# Patient Record
Sex: Female | Born: 1990 | Hispanic: Yes | Marital: Single | Smoking: Never smoker
Health system: Southern US, Community
[De-identification: ages and names within clinical notes are randomized; demographics above are authoritative.]

## PROBLEM LIST (undated history)

## (undated) DIAGNOSIS — E119 Type 2 diabetes mellitus without complications: Secondary | ICD-10-CM

## (undated) DIAGNOSIS — F419 Anxiety disorder, unspecified: Secondary | ICD-10-CM

## (undated) DIAGNOSIS — E669 Obesity, unspecified: Secondary | ICD-10-CM

## (undated) DIAGNOSIS — E282 Polycystic ovarian syndrome: Secondary | ICD-10-CM

## (undated) DIAGNOSIS — D649 Anemia, unspecified: Secondary | ICD-10-CM

## (undated) DIAGNOSIS — F32A Depression, unspecified: Secondary | ICD-10-CM

## (undated) HISTORY — PX: WISDOM TOOTH EXTRACTION: SHX21

## (undated) HISTORY — DX: Anxiety disorder, unspecified: F41.9

## (undated) HISTORY — DX: Polycystic ovarian syndrome: E28.2

## (undated) HISTORY — DX: Anemia, unspecified: D64.9

## (undated) HISTORY — DX: Obesity, unspecified: E66.9

## (undated) HISTORY — DX: Depression, unspecified: F32.A

---
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
## 2009-12-11 ENCOUNTER — Ambulatory Visit: Payer: Self-pay | Admitting: Internal Medicine

## 2010-01-06 ENCOUNTER — Ambulatory Visit: Payer: Self-pay | Admitting: Internal Medicine

## 2011-08-19 ENCOUNTER — Encounter: Payer: Self-pay | Admitting: Maternal & Fetal Medicine

## 2011-08-19 LAB — TSH: Thyroid Stimulating Horm: 1.48 u[IU]/mL

## 2011-08-19 LAB — PROTEIN / CREATININE RATIO, URINE
Creatinine, Urine: 78.2 mg/dL (ref 30.0–125.0)
Protein, Random Urine: 11 mg/dL (ref 0–12)
Protein/Creat. Ratio: 141 mg/gCREAT (ref 0–200)

## 2011-08-19 LAB — BASIC METABOLIC PANEL
Anion Gap: 12 (ref 7–16)
BUN: 8 mg/dL (ref 7–18)
EGFR (African American): >60
Glucose: 78 mg/dL (ref 65–99)
Potassium: 4 mmol/L (ref 3.5–5.1)
Sodium: 142 mmol/L (ref 136–145)

## 2011-08-19 LAB — HEPATIC FUNCTION PANEL A (ARMC)
Albumin: 3.1 g/dL — ABNORMAL LOW (ref 3.4–5.0)
Alkaline Phosphatase: 47 U/L — ABNORMAL LOW (ref 50–136)
Bilirubin,Total: 0.3 mg/dL (ref 0.2–1.0)
SGOT(AST): 20 U/L (ref 15–37)
SGPT (ALT): 21 U/L
Total Protein: 7.3 g/dL (ref 6.4–8.2)

## 2012-01-28 ENCOUNTER — Inpatient Hospital Stay: Payer: Self-pay

## 2012-01-28 LAB — CBC WITH DIFFERENTIAL/PLATELET
Basophil %: 0.6 %
Eosinophil %: 0.2 %
HCT: 36.9 % (ref 35.0–47.0)
MCV: 79 fL — ABNORMAL LOW (ref 80–100)
Monocyte %: 4.8 %
RBC: 4.68 10*6/uL (ref 3.80–5.20)
WBC: 13.3 10*3/uL — ABNORMAL HIGH (ref 3.6–11.0)

## 2012-01-28 LAB — HEMOGLOBIN: HGB: 10.2 g/dL — ABNORMAL LOW (ref 12.0–16.0)

## 2014-06-21 NOTE — L&D Delivery Note (Signed)
VAGINAL DELIVERY NOTE:  Date of Delivery: 02/22/2015 Primary OB: [facility]  Gestational Age/EDD: [redacted]w[redacted]d 02/27/2015, Alternate EDD Entry Antepartum complications: none Attending Physician: Annamarie Major, MD, FACOG Delivery Type: spontaneous vaginal delivery  Anesthesia: epidural Laceration: 2nd degree Episiotomy: none Placenta: spontaneous, uterine inversion noted and replaced without difficulty; pt stable throughout Intrapartum complications: None Estimated Blood Loss: 500 mL GBS: neg Baby: Liveborn female, Apgars 8/9, weight 8 #, 15 oz,

## 2014-10-13 NOTE — Consult Note (Signed)
Referral Information:   Reason for Referral 24 yo G1 EDD 02/02/12 by US performed on 07/29/11 (EGA at the time of that [place]: 13 weeks 1 day. She is 16 weeks 1 day today and referred secondary to BMI >40 and history of bariatric surgery (lap banding) in 2010. She lost 70 lbs following the surgery and has not regained this weight.    Referring Physician [facility], Dr. Georgianne Fick    Prenatal Hx as above    Past Obstetrical Hx G1   Home Medications: Medication Instructions Status  Prenatal Multivitamins oral tablet 1 tab(s) orally once a day Active   Allergies:   No Known Allergies:   Vital Signs/Notes:  Nursing Vital Signs: **Vital Signs.:   28-Feb-13 13:14   Vital Signs Type Routine   Temperature Temperature (F) 98.7   Celsius 37   Temperature Source oral   Pulse Pulse 79   Pulse source per Dinamap   Respirations Respirations 14   Systolic BP Systolic BP 740   Diastolic BP (mmHg) Diastolic BP (mmHg) 55   Mean BP 74   BP Source Dinamap   Perinatal Consult:   PMed Hx Rubella Immune, Hx of varicella    Past Medical History cont'd 1.  History of ?elevated liver function testing--she reports elevation in lfts since a prolonged viral illness during middle school.  She denies history of EBV, but states her lfts have been persistently elevated since that 3-week long illness. She denies having liver imaging/ultrasound.  She states hepatitis screening and other evalations were negative. 2. Obesity/Lap Band Surgery/Insulin resistance--she reports insulin resistance prior to her bariatric surgery.  It is unclear whether her 70-lb weight loss corrected this condition.  She was temoporarily placed on metformin but experienced mood swings/anger while on this treatment so she discontinued the metformin and opted to pursue surgical intervention. She has seen a primary care MD (she is unsure of name)at [facility].    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    Occupation Father [employer]    Soc Hx no tobacco, etoh, ilicit drug use   Review Of Systems:   Medications/Allergies Reviewed Medications/Allergies reviewed   Misc Urine Chem:  28-Feb-13 13:53    Creatinine, Urine 78.2   Protein, Random Urine 11   Protein/Creat Ratio (comp) 141  Routine Chem:  28-Feb-13 14:09    Glucose, Serum 78   BUN 8   Creatinine (comp) 0.51   Sodium, Serum 142   Potassium, Serum 4.0   Chloride, Serum 105   CO2, Serum 25   Calcium (Total), Serum 9.0   Anion Gap 12   Osmolality (calc) 280   eGFR (African American) >60   eGFR (Non-African American) >60  Thyroid:  28-Feb-13 14:09    Thyroid Stimulating Hormone 1.48   Impression/Recommendations:   Impression 24 yo G1 at 16 weeks 1 day with history of insulin resistance (prior to bariatric surgery), gastric banding, and BMI>40.  We addressed concerns related to obesity in pregnacy such as: preeclampsia, gestational diabetes, and need for operative delivery.  We addressed the potential benefits of low dose aspirin administration for preeclampsia risk reduction in patients at high risk for the development of preeclampsia.  We also readdressed weight gain recommenations (15-20lbs) as well as the need for healthy dietary choices and avoidance of overt weight loss attempts at this time.  We also reviewed benefits of  breastfeeding for potential weight maintenance as well as for diabetes risk reduction. We addresed the fact that the lap band surgery is not associated with increased pregnancy risk or metabolic derangements (unlike other bariatric surgeries).    Recommendations 1. Baseline CMP, p:c ratio, and liver function testing.  The CMP and p:c ratio were normal today.  If the liver function testing is abnormal, she can be referred to GI for further evaluation. We reviewed her normal glucola/GDM screening and the need for repeat  screening at [redacted] weeks gestation.  2. TSH was normal today 3. Recommend pregnancy surveillance with detailed ultrasound and fetal growth assessements as outlined below. She declined first trimester screening 4.  I recommended she initate low dose/baby aspirin (81 mg/daily) 5. We addressed healthy diet and exercise (walking) She declined lifestyles referral to nutritionist as she has seen nutritionists in the past 6. Baseline ECG   Plan:   Genetic Counseling no    Prenatal Diagnosis Options Level II [place], declined first trimester screening    Ultrasound at what gestational ages Monthly > 28 weeks    Additional Testing Thyroid panel, normal TSH today     Total Time Spent with Patient 45 minutes    >50% of visit spent in couseling/coordination of care yes    Office Use Only 99243  Level 3 (65mn) NEW office consult detailed   Coding Description: MATERNAL CONDITIONS/HISTORY INDICATION(S).   Obesity - BMI greater than equal to 30.   OTHER: bariatric surgery.  Electronic Signatures: SManfred Shirts(MD)  (Signed [id number])  Authored: Referral, Home Medications, Allergies, Vital Signs/Notes, Consult, Exam, Lab, Impression, Plan, Billing, Coding Description   Last Updated: 28-Feb-13 15:41 by SManfred Shirts(MD)

## 2014-10-29 NOTE — H&P (Signed)
L&D Evaluation:  History Expanded:   HPI 24 yo G1 whose EDC = 02/09/12.  Pt presents in labor    Blood Type (Maternal) A positive    Group B Strep Results Maternal (Result >5wks must be treated as unknown) negative    Maternal HIV Negative    Maternal Syphilis Ab Nonreactive    Maternal Varicella Immune    Rubella Results (Maternal) immune    Patient's Medical History ob3esity    Patient's Surgical History Lap band gastric bypass    Medications Pre Natal Vitamins    Allergies PCN   Exam:   Vital Signs stable    Chest clear    Heart normal sinus rhythm    Abdomen gravid, tender with contractions    Estimated Fetal Weight Average for gestational age    Back no CVAT    Pelvic 6 cm    Ucx regular   Impression:   Impression active labor   Electronic Signatures: Towana Badgerosenow, Philip J (MD)  (Signed 09-Aug-13 07:04)  Authored: L&D Evaluation   Last Updated: 09-Aug-13 07:04 by Towana Badgerosenow, Philip J (MD)

## 2014-12-16 ENCOUNTER — Encounter: Payer: Self-pay | Admitting: *Deleted

## 2014-12-16 ENCOUNTER — Encounter: Payer: Medicaid Other | Attending: Certified Nurse Midwife | Admitting: *Deleted

## 2014-12-16 VITALS — BP 100/70 | Ht 66.5 in | Wt 292.6 lb

## 2014-12-16 DIAGNOSIS — O24419 Gestational diabetes mellitus in pregnancy, unspecified control: Secondary | ICD-10-CM | POA: Insufficient documentation

## 2014-12-16 DIAGNOSIS — O24410 Gestational diabetes mellitus in pregnancy, diet controlled: Secondary | ICD-10-CM

## 2014-12-16 NOTE — Progress Notes (Signed)
Diabetes Self-Management Education  Visit Type: First/Initial  Appt. Start Time: 0900 Appt. End Time: 1030  12/16/2014  [name], identified by name and date of birth, is a 24 y.o. female with a diagnosis of Diabetes: Gestational Diabetes.    ASSESSMENT Blood pressure 100/70, height 5' 6.5" (1.689 m), weight 292 lb 9.6 oz (132.722 kg), last menstrual period 05/23/2014. Body mass index is 46.52 kg/(m^2).  Initial Visit Information: Are you currently following a meal plan?: Yes What type of meal plan do you follow?: drinking more water and eating less fast food Are you taking your medications as prescribed?: Yes Are you checking your feet?: Yes How many days per week are you checking your feet?: 2 How often do you need to have someone help you when you read instructions, pamphlets, or other written materials from your doctor or pharmacy?: 1 - Never What is the last grade level you completed in school?: graduate [employer]  Psychosocial: Patient Belief/Attitude about Diabetes: Motivated to manage diabetes Self-care barriers: None Self-management support: Friends, Doctor's office Patient Concerns: Nutrition/Meal planning, Glycemic Control, Weight Control, Healthy Lifestyle, Monitoring Preferred Learning Style: Hands on, Auditory Learning Readiness: Ready  Complications:  How often do you check your blood sugar?: 0 times/day (not testing) (Provided Accu-Check Nano meter and instructed on use. BG upon return demonstration was 106 mg/dL at 96:0410:25 am - fasting. ) Have you had a dilated eye exam in the past 12 months?: No Have you had a dental exam in the past 12 months?: Yes  Diet Intake: Breakfast: eats 3 meals/day Snack (morning): eats 3 snacks/day Dinner: suppper may be at 8 pm Beverage(s): drinks sugar sweetened beverages and occasional fruit juices  Exercise: Exercise: Light (walking at work and running after 24 year old child)  Individualized Plan for Diabetes Self-Management  Training:  Learning Objective:  Patient will have a greater understanding of diabetes self-management.  Education Topics Reviewed with Patient Today: Definition of diabetes, type 1 and 2, and the diagnosis of diabetes, Factors that contribute to the development of diabetes Role of diet in the treatment of diabetes and the relationship between the three main macronutrients and blood glucose level Role of exercise on diabetes management, blood pressure control and cardiac health. Taught/evaluated SMBG meter., Purpose and frequency of SMBG., Identified appropriate SMBG and/or A1C goals. Relationship between chronic complications and blood glucose control Pregnancy and GDM  Role of pre-pregnancy blood glucose control on the development of the fetus, Reviewed with patient blood glucose goals with pregnancy  PATIENTS GOALS/Plan (Developed by the patient): Improve blood sugars Prevent diabetes complications Lose weight Lead a healthier lifestyle Become more fit  Plan:  Read booklet on Gestational Diabetes Follow Gestational Meal Planning Guidelines Complete a 3 Day Food Record and bring to next appointment Check blood sugars 4 x day - before breakfast and 2 hrs after every meal and record  Call MD for prescription for meter strips and lancets Strips Accu-Check Smartview Lancets Accu-Chek FastClix Bring blood sugar log to next appointment Walk 20-30 minutes at least 5 x week if permitted by MD  Expected Outcomes:  Demonstrated interest in learning. Expect positive outcomes  Education material provided: Gestational Meal Planning Guidelines Viewed Gestational Diabetes Video Meter - Accu-Check Nano 3 Day Food Record Goals for a Healthy Pregnancy  If problems or questions, patient to contact team via:   Sharion SettlerSheila Shotwell, RN, CCM, CDE [phone]  Future DSME appointment:  Friday July 8 , 2016  at 1:00 pm with dietitian

## 2014-12-16 NOTE — Patient Instructions (Signed)
Read booklet on Gestational Diabetes Follow Gestational Meal Planning Guidelines Complete a 3 Day Food Record and bring to next appointment Check blood sugars 4 x day - before breakfast and 2 hrs after every meal and record  Call MD for prescription for meter strips and lancets Strips Accu-Check Smartview Lancets Accu-Chek FastClix Bring blood sugar log to next appointment Walk 20-30 minutes at least 5 x week if permitted by MD

## 2014-12-27 ENCOUNTER — Encounter: Payer: Medicaid Other | Attending: Certified Nurse Midwife | Admitting: Dietician

## 2014-12-27 VITALS — BP 110/62 | Ht 66.5 in | Wt 292.0 lb

## 2014-12-27 DIAGNOSIS — O24419 Gestational diabetes mellitus in pregnancy, unspecified control: Secondary | ICD-10-CM | POA: Insufficient documentation

## 2014-12-27 NOTE — Progress Notes (Signed)
Patient's BG record indicates BGs have improved since beginning Glyburide, although FBGs till above goal, ranging 93-122 since 12/20/14. Post-meal BGs are ranging 84-135, + one reading of 181 on July 4th. Patient's food diary indicates cold cereal for breakfast and some sugar-sweetened drinks  Provided 1700kcal meal plan, and wrote individualized menus based on patient's food preferences. Emphasized importance of including protein with all meals; advised small bowl of lower-sugar cereal at breakfast with protein such as nuts. Advised patient to bring snacks to work in case of delayed or missed lunch. Instructed patient on food safety, including avoidance of Listeriosis, and limiting mercury from fish. Discussed importance of maintaining healthy lifestyle habits to reduce risk of Type 2 DM as well as Gestational DM with any future pregnancies. Advised patient to use any remaining testing supplies to test some BGs after delivery, and to have BG tested ideally annually, as well as prior to attempting future pregnancies.

## 2014-12-27 NOTE — Patient Instructions (Signed)
   Work to eat every 3-5 hours during the day; take snacks (peanut butter crackers, yogurt, protein drink) to work in case you don't get a meal break.  Include protein such as nuts with cereal, keep cereal portion small. Try Special K, cheerios for lower sugar.  OK to use some Splenda in drinks such as tea or lemonade; avoid sugary drinks.

## 2015-02-18 LAB — OB RESULTS CONSOLE GBS: STREP GROUP B AG: NEGATIVE

## 2015-02-18 LAB — OB RESULTS CONSOLE HIV ANTIBODY (ROUTINE TESTING): HIV: NONREACTIVE

## 2015-02-21 ENCOUNTER — Encounter: Payer: Self-pay | Admitting: *Deleted

## 2015-02-21 ENCOUNTER — Inpatient Hospital Stay
Admission: EM | Admit: 2015-02-21 | Discharge: 2015-02-24 | DRG: 775 | Disposition: A | Discharge status: Home / Self Care | Payer: Medicaid Other | Attending: Obstetrics & Gynecology | Admitting: Obstetrics & Gynecology

## 2015-02-21 DIAGNOSIS — O99214 Obesity complicating childbirth: Secondary | ICD-10-CM | POA: Diagnosis present

## 2015-02-21 DIAGNOSIS — O24429 Gestational diabetes mellitus in childbirth, unspecified control: Secondary | ICD-10-CM | POA: Diagnosis present

## 2015-02-21 DIAGNOSIS — O133 Gestational [pregnancy-induced] hypertension without significant proteinuria, third trimester: Secondary | ICD-10-CM | POA: Diagnosis present

## 2015-02-21 DIAGNOSIS — Z3A39 39 weeks gestation of pregnancy: Secondary | ICD-10-CM | POA: Diagnosis present

## 2015-02-21 DIAGNOSIS — Z6841 Body Mass Index (BMI) 40.0 and over, adult: Secondary | ICD-10-CM

## 2015-02-21 DIAGNOSIS — E669 Obesity, unspecified: Secondary | ICD-10-CM | POA: Diagnosis present

## 2015-02-21 HISTORY — DX: Type 2 diabetes mellitus without complications: E11.9

## 2015-02-21 LAB — CBC
HCT: 34.1 % — ABNORMAL LOW (ref 35.0–47.0)
Hemoglobin: 11.2 g/dL — ABNORMAL LOW (ref 12.0–16.0)
MCH: 26 pg (ref 26.0–34.0)
MCHC: 33 g/dL (ref 32.0–36.0)
MCV: 78.9 fL — AB (ref 80.0–100.0)
PLATELETS: 225 10*3/uL (ref 150–440)
RBC: 4.32 MIL/uL (ref 3.80–5.20)
RDW: 16.7 % — AB (ref 11.5–14.5)
WBC: 7.2 10*3/uL (ref 3.6–11.0)

## 2015-02-21 LAB — COMPREHENSIVE METABOLIC PANEL
ALK PHOS: 181 U/L — AB (ref 38–126)
ALT: 18 U/L (ref 14–54)
AST: 34 U/L (ref 15–41)
Albumin: 2.5 g/dL — ABNORMAL LOW (ref 3.5–5.0)
Anion gap: 6 (ref 5–15)
BILIRUBIN TOTAL: 0.5 mg/dL (ref 0.3–1.2)
BUN: 14 mg/dL (ref 6–20)
CALCIUM: 8.7 mg/dL — AB (ref 8.9–10.3)
CO2: 24 mmol/L (ref 22–32)
CREATININE: 0.88 mg/dL (ref 0.44–1.00)
Chloride: 107 mmol/L (ref 101–111)
Glucose, Bld: 98 mg/dL (ref 65–99)
Potassium: 3.9 mmol/L (ref 3.5–5.1)
Sodium: 137 mmol/L (ref 135–145)
Total Protein: 6.3 g/dL — ABNORMAL LOW (ref 6.5–8.1)

## 2015-02-21 LAB — TYPE AND SCREEN
ABO/RH(D): A POS
Antibody Screen: NEGATIVE

## 2015-02-21 LAB — GLUCOSE, CAPILLARY
GLUCOSE-CAPILLARY: 82 mg/dL (ref 65–99)
GLUCOSE-CAPILLARY: 104 mg/dL — AB (ref 65–99)
GLUCOSE-CAPILLARY: 101 mg/dL — AB (ref 65–99)

## 2015-02-21 LAB — ABO/RH: ABO/RH(D): A POS

## 2015-02-21 MED ORDER — OXYTOCIN 40 UNITS IN LACTATED RINGERS INFUSION - SIMPLE MED
62.5000 mL/h | INTRAVENOUS | Status: DC
  Filled 2015-02-21: qty 1000

## 2015-02-21 MED ORDER — AMMONIA AROMATIC IN INHA
0.3000 mL | Freq: Once | RESPIRATORY_TRACT | Status: AC | PRN
Start: 2015-02-21 — End: 2015-02-22
  Administered 2015-02-22: 0.3 mL via RESPIRATORY_TRACT

## 2015-02-21 MED ORDER — MISOPROSTOL 200 MCG PO TABS
800.0000 ug | ORAL_TABLET | Freq: Once | ORAL | Status: AC | PRN
  Administered 2015-02-22: 800 ug via RECTAL

## 2015-02-21 MED ORDER — ONDANSETRON HCL 4 MG/2ML IJ SOLN
4.0000 mg | Freq: Four times a day (QID) | INTRAMUSCULAR | Status: DC | PRN
  Administered 2015-02-22: 4 mg via INTRAVENOUS
  Filled 2015-02-21: qty 2

## 2015-02-21 MED ORDER — OXYTOCIN BOLUS FROM INFUSION
500.0000 mL | INTRAVENOUS | Status: DC
  Administered 2015-02-22: 500 mL via INTRAVENOUS

## 2015-02-21 MED ORDER — LACTATED RINGERS IV SOLN: 500.0000 mL | INTRAVENOUS | Status: DC | PRN

## 2015-02-21 MED ORDER — LIDOCAINE HCL (PF) 1 % IJ SOLN: 30.0000 mL | INTRAMUSCULAR | Status: DC | PRN

## 2015-02-21 MED ORDER — LACTATED RINGERS IV SOLN
INTRAVENOUS | Status: DC
  Administered 2015-02-21 – 2015-02-22 (×2): via INTRAVENOUS
  Administered 2015-02-22: 125 mL/h via INTRAVENOUS

## 2015-02-21 MED ORDER — DINOPROSTONE 10 MG VA INST
10.0000 mg | VAGINAL_INSERT | Freq: Once | VAGINAL | Status: AC
  Administered 2015-02-21: 10 mg via VAGINAL
  Filled 2015-02-21: qty 1

## 2015-02-21 MED ORDER — TERBUTALINE SULFATE 1 MG/ML IJ SOLN: 0.2500 mg | Freq: Once | INTRAMUSCULAR | Status: DC | PRN

## 2015-02-21 NOTE — H&P (Signed)
Date of Initial H&P: 18 February 2015  History reviewed, patient examined. Agree with H&P except for changes noted below:  24 year old female with EDC=02/27/2015 by LMP=13 wk ultrasound presents for IOL at 39.1 weeks for GDMA2 on glyburide (2.5mg  am/7.5mg  pm). Pregnancy complicated by chronic hypertension, GDMA2, obesity (BMI >40) She also has a history of lap band surgery. Last growth scan showed fetus weighing 3350g (7lb, 6oz) c/w 77.2%ile at [redacted]w[redacted]d gestation. Her AC was 97.1 %ile.  She has gained 29 lbs this pregnancy.  Her pelvis is tested to 7 lb 13oz.  Cervical exam by Farrel Conners, CNM, showed cervix 2/60/-2 Random blood glucose was 82 mg/dL.  Bedside u/s showed fetus in vertex presentation.  Amniotic fluid appears subjectively low.  Very difficult ultrasound, acoustically limited by body habitus.    Plan cervidil tonight.  GBS negative.  FWB reassuring overall (cat 1 tracing).

## 2015-02-21 NOTE — Progress Notes (Signed)
May D/C EFM until room available and ready to start Cervadil. May eat a regular diet

## 2015-02-22 ENCOUNTER — Inpatient Hospital Stay: Payer: Medicaid Other | Admitting: Anesthesiology

## 2015-02-22 ENCOUNTER — Encounter: Payer: Self-pay | Admitting: Anesthesiology

## 2015-02-22 LAB — GLUCOSE, CAPILLARY
GLUCOSE-CAPILLARY: 96 mg/dL (ref 65–99)
Glucose-Capillary: 113 mg/dL — ABNORMAL HIGH (ref 65–99)
Glucose-Capillary: 118 mg/dL — ABNORMAL HIGH (ref 65–99)
Glucose-Capillary: 78 mg/dL (ref 65–99)
Glucose-Capillary: 96 mg/dL (ref 65–99)

## 2015-02-22 LAB — CBC
HEMATOCRIT: 29.9 % — AB (ref 35.0–47.0)
Hemoglobin: 9.6 g/dL — ABNORMAL LOW (ref 12.0–16.0)
MCH: 25.5 pg — ABNORMAL LOW (ref 26.0–34.0)
MCHC: 32.1 g/dL (ref 32.0–36.0)
MCV: 79.4 fL — AB (ref 80.0–100.0)
Platelets: 217 10*3/uL (ref 150–440)
RBC: 3.76 MIL/uL — AB (ref 3.80–5.20)
RDW: 16.6 % — ABNORMAL HIGH (ref 11.5–14.5)
WBC: 15.7 10*3/uL — AB (ref 3.6–11.0)

## 2015-02-22 LAB — CHLAMYDIA/NGC RT PCR (ARMC ONLY)
Chlamydia Tr: NOT DETECTED
N gonorrhoeae: NOT DETECTED

## 2015-02-22 LAB — RPR: RPR Ser Ql: NONREACTIVE

## 2015-02-22 MED ORDER — PRENATAL MULTIVITAMIN CH
1.0000 | ORAL_TABLET | Freq: Every day | ORAL | Status: DC
  Administered 2015-02-23 – 2015-02-24 (×2): 1 via ORAL
  Filled 2015-02-22 (×2): qty 1

## 2015-02-22 MED ORDER — KETOROLAC TROMETHAMINE 30 MG/ML IJ SOLN: 30.0000 mg | Freq: Four times a day (QID) | INTRAMUSCULAR | Status: DC | PRN

## 2015-02-22 MED ORDER — IBUPROFEN 600 MG PO TABS
600.0000 mg | ORAL_TABLET | Freq: Four times a day (QID) | ORAL | Status: DC
  Administered 2015-02-22 – 2015-02-24 (×7): 600 mg via ORAL
  Filled 2015-02-22 (×7): qty 1

## 2015-02-22 MED ORDER — DIPHENHYDRAMINE HCL 50 MG/ML IJ SOLN: 12.5000 mg | INTRAMUSCULAR | Status: DC | PRN

## 2015-02-22 MED ORDER — WITCH HAZEL-GLYCERIN EX PADS: 1.0000 "application " | MEDICATED_PAD | CUTANEOUS | Status: DC | PRN

## 2015-02-22 MED ORDER — MEPERIDINE HCL 25 MG/ML IJ SOLN: 6.2500 mg | INTRAMUSCULAR | Status: DC | PRN

## 2015-02-22 MED ORDER — SENNOSIDES-DOCUSATE SODIUM 8.6-50 MG PO TABS
2.0000 | ORAL_TABLET | ORAL | Status: DC
  Administered 2015-02-23 – 2015-02-24 (×2): 2 via ORAL
  Filled 2015-02-22 (×2): qty 2

## 2015-02-22 MED ORDER — NALBUPHINE HCL 10 MG/ML IJ SOLN: 5.0000 mg | Freq: Once | INTRAMUSCULAR | Status: DC | PRN

## 2015-02-22 MED ORDER — NALBUPHINE HCL 10 MG/ML IJ SOLN: 5.0000 mg | INTRAMUSCULAR | Status: DC | PRN

## 2015-02-22 MED ORDER — ONDANSETRON HCL 4 MG/2ML IJ SOLN: 4.0000 mg | INTRAMUSCULAR | Status: DC | PRN

## 2015-02-22 MED ORDER — ONDANSETRON HCL 4 MG PO TABS: 4.0000 mg | ORAL_TABLET | ORAL | Status: DC | PRN

## 2015-02-22 MED ORDER — ONDANSETRON HCL 4 MG/2ML IJ SOLN: 4.0000 mg | Freq: Three times a day (TID) | INTRAMUSCULAR | Status: DC | PRN

## 2015-02-22 MED ORDER — BENZOCAINE-MENTHOL 20-0.5 % EX AERO: 1.0000 "application " | INHALATION_SPRAY | CUTANEOUS | Status: DC | PRN

## 2015-02-22 MED ORDER — DIBUCAINE 1 % RE OINT: 1.0000 "application " | TOPICAL_OINTMENT | RECTAL | Status: DC | PRN

## 2015-02-22 MED ORDER — LIDOCAINE-EPINEPHRINE (PF) 1.5 %-1:200000 IJ SOLN
INTRAMUSCULAR | Status: DC | PRN
  Administered 2015-02-22: 3 mL via PERINEURAL

## 2015-02-22 MED ORDER — DIPHENHYDRAMINE HCL 25 MG PO CAPS: 25.0000 mg | ORAL_CAPSULE | Freq: Four times a day (QID) | ORAL | Status: DC | PRN

## 2015-02-22 MED ORDER — ZOLPIDEM TARTRATE 5 MG PO TABS: 5.0000 mg | ORAL_TABLET | Freq: Every evening | ORAL | Status: DC | PRN

## 2015-02-22 MED ORDER — LANOLIN HYDROUS EX OINT: TOPICAL_OINTMENT | CUTANEOUS | Status: DC | PRN

## 2015-02-22 MED ORDER — SODIUM CHLORIDE 0.9 % IJ SOLN
INTRAMUSCULAR | Status: AC
  Filled 2015-02-22: qty 50

## 2015-02-22 MED ORDER — FENTANYL 2.5 MCG/ML W/ROPIVACAINE 0.2% IN NS 100 ML EPIDURAL INFUSION (ARMC-ANES)
EPIDURAL | Status: AC
  Administered 2015-02-22: 10 mL/h via EPIDURAL
  Filled 2015-02-22: qty 100

## 2015-02-22 MED ORDER — DEXTROSE 5 % IV SOLN: 1.0000 ug/kg/h | INTRAVENOUS | Status: DC | PRN

## 2015-02-22 MED ORDER — NALOXONE HCL 0.4 MG/ML IJ SOLN: 0.4000 mg | INTRAMUSCULAR | Status: DC | PRN

## 2015-02-22 MED ORDER — DIPHENHYDRAMINE HCL 25 MG PO CAPS: 25.0000 mg | ORAL_CAPSULE | ORAL | Status: DC | PRN

## 2015-02-22 MED ORDER — OXYCODONE-ACETAMINOPHEN 5-325 MG PO TABS: 2.0000 | ORAL_TABLET | ORAL | Status: DC | PRN

## 2015-02-22 MED ORDER — HYDROMORPHONE HCL 1 MG/ML IJ SOLN
INTRAMUSCULAR | Status: AC
  Administered 2015-02-22: 1 mg via INTRAVENOUS
  Filled 2015-02-22: qty 1

## 2015-02-22 MED ORDER — SODIUM CHLORIDE 0.9 % IJ SOLN: 3.0000 mL | INTRAMUSCULAR | Status: DC | PRN

## 2015-02-22 MED ORDER — SODIUM CHLORIDE 0.9 % IJ SOLN
3.0000 mL | Freq: Two times a day (BID) | INTRAMUSCULAR | Status: DC
  Administered 2015-02-22 – 2015-02-23 (×2): 3 mL via INTRAVENOUS

## 2015-02-22 MED ORDER — ACETAMINOPHEN 325 MG PO TABS: 650.0000 mg | ORAL_TABLET | ORAL | Status: DC | PRN

## 2015-02-22 MED ORDER — SIMETHICONE 80 MG PO CHEW: 80.0000 mg | CHEWABLE_TABLET | ORAL | Status: DC | PRN

## 2015-02-22 MED ORDER — SODIUM CHLORIDE 0.9 % IV SOLN: 250.0000 mL | INTRAVENOUS | Status: DC | PRN

## 2015-02-22 MED ORDER — HYDROMORPHONE HCL 1 MG/ML IJ SOLN
0.5000 mg | Freq: Once | INTRAMUSCULAR | Status: AC
Start: 2015-02-22 — End: 2015-02-22
  Administered 2015-02-22: 1 mg via INTRAVENOUS

## 2015-02-22 MED ORDER — SODIUM CHLORIDE 0.9 % IJ SOLN
INTRAMUSCULAR | Status: AC
  Filled 2015-02-22: qty 3

## 2015-02-22 MED ORDER — FENTANYL 2.5 MCG/ML W/ROPIVACAINE 0.2% IN NS 100 ML EPIDURAL INFUSION (ARMC-ANES)
10.0000 mL/h | EPIDURAL | Status: DC
  Administered 2015-02-22: 10 mL/h via EPIDURAL

## 2015-02-22 MED ORDER — OXYCODONE-ACETAMINOPHEN 5-325 MG PO TABS: 1.0000 | ORAL_TABLET | ORAL | Status: DC | PRN

## 2015-02-22 MED ORDER — BUPIVACAINE HCL (PF) 0.25 % IJ SOLN
INTRAMUSCULAR | Status: DC | PRN
  Administered 2015-02-22 (×2): 2.5 mL

## 2015-02-22 MED ORDER — OXYTOCIN 40 UNITS IN LACTATED RINGERS INFUSION - SIMPLE MED: 62.5000 mL/h | INTRAVENOUS | Status: DC | PRN

## 2015-02-22 NOTE — Progress Notes (Signed)
  Labor Progress Note   24 y.o. G2P1000 @ [redacted]w[redacted]d , admitted for  Pregnancy, Labor Management. GNMA2, HTN (well controlled).  Subjective:  Pain improved w epidural. Cervadil last night. Natural ctxs now. BS 115 BP stable, normal No ha, blurry vision, epig pain, CP, SOB.  Objective:  BP 131/67 mmHg  Pulse 80  Temp(Src) 97.9 F (36.6 C) (Oral)  Resp 22  LMP 05/23/2014 Abd: Gravid, Vtx, Obese Extr: 1+ bilateral pedal edema SVE: CERVIX: 8-9 cm dilated, 90 effaced, +1 station  EFM: FHR: 140 bpm, variability: moderate,  accelerations:  Present,  decelerations:  Absent Toco: Frequency: Every 3 minutes Labs: I have reviewed the patient's lab results.   Assessment & Plan:  G2P1000 @ [redacted]w[redacted]d, admitted for  Pregnancy and Labor/Delivery Management  1. Pain management: epidural. 2. FWB: FHT category 1.  3. ID: GBS negative 4. Labor management: Rapid progress since epidural.  Anticpate second stage soon. Monitor BS and BP.  All discussed with patient, see orders

## 2015-02-22 NOTE — Anesthesia Procedure Notes (Signed)
Epidural Patient location during procedure: OB Start time: 02/22/2015 7:56 AM End time: 02/22/2015 8:08 AM  Staffing Anesthesiologist: Lenard Simmer Performed by: anesthesiologist   Preanesthetic Checklist Completed: patient identified, site marked, surgical consent, pre-op evaluation, timeout performed, IV checked, risks and benefits discussed and monitors and equipment checked  Epidural Patient position: sitting Prep: ChloraPrep Patient monitoring: heart rate, continuous pulse ox, blood pressure and cardiac monitor Approach: midline Location: L3-L4 Injection technique: LOR saline  Needle:  Needle type: Tuohy  Needle gauge: 17 G Needle length: 9 cm Needle insertion depth: 8 cm Catheter type: closed end Catheter size: 19 Gauge Catheter at skin depth: 13 cm Test dose: negative and 1.5% lidocaine with Epi 1:200 K  Additional Notes Reason for block:procedure for pain

## 2015-02-22 NOTE — Discharge Summary (Signed)
Obstetrical Discharge Summary  Date of Admission: 02/21/2015 Date of Discharge: 02/24/15  Discharge Diagnosis: Term Pregnancy-delivered and Gest Diabetes and Gest HTN Primary OB:  [facility]   Gestational Age at Delivery: [redacted]w[redacted]d  Antepartum complications: gestational diabetes and pregnancy-induced hypertension Date of Delivery: 02/22/15  Delivered By: Tiburcio Pea Delivery Type: spontaneous vaginal delivery Intrapartum complications/course: None Anesthesia: epidural Placenta: spontaneous and uterine inversion quickly noted and replaced without difficulty Laceration: 2nd degree Episiotomy: none Live born female  Birth Weight: 8 lb 15.2 oz (4060 g) APGAR: 8, 9   Post partum course: Since the delivery, patient has tolerated activity, diet, and daily functions without difficulty or complication.  Minimal lochia.  No breast concerns at this time.  No signs of depression currently.   Postpartum Exam: Gen: NAD CV: RRR PULM: ctabl, nl effort ABD: s/nd/nt fundus firm and below umbilicus EXT: no edema or tenderness on bilateral calves   Disposition: home with infant Rh Immune globulin given: not applicable Rubella vaccine given: not applicable Varicella vaccine given: not applicable Tdap vaccine given in AP or PP setting: given during prenatal care Flu vaccine given in AP or PP setting: not applicable Contraception: Nexplanon, to be determined at post partum visit  Prenatal Labs: A POS//Rubella Immune//RPR negative//HIV negative/HepB Surface Ag negative//plans to breastfeed  Plan:  Katherine Chase was discharged to home in good condition. Follow-up appointment with [facility] provider in 6 weeks  Discharge Medications:   Medication List    STOP taking these medications        ACCU-CHEK FASTCLIX LANCETS Misc     ACCU-CHEK SMARTVIEW test strip  Generic drug:  glucose blood     aspirin 81 MG tablet     glyBURIDE 2.5 MG tablet  Commonly known as:  DIABETA      TAKE these medications        acetaminophen 325 MG tablet  Commonly known as:  TYLENOL  Take 2 tablets (650 mg total) by mouth every 4 (four) hours as needed (for pain scale < 4).     ferrous sulfate 325 (65 FE) MG tablet  Take 325 mg by mouth daily with breakfast.     folic acid 400 MCG tablet  Commonly known as:  FOLVITE  Take 400 mcg by mouth daily.     ibuprofen 600 MG tablet  Commonly known as:  ADVIL,MOTRIN  Take 1 tablet (600 mg total) by mouth every 6 (six) hours.     prenatal multivitamin Tabs tablet  Take 1 tablet by mouth daily at 12 noon.        Follow-up arrangements:  Follow-up Information    Follow up with Letitia Libra, MD In 6 weeks.   Specialty:  Obstetrics and Gynecology   Why:  for routine post partum check   Contact information:   [address] [place] [address] [phone]       Follow up with Letitia Libra, MD. Schedule an appointment as soon as possible for a visit in 6 weeks.   Specialty:  Obstetrics and Gynecology   Why:  for 2 hour glucose test   Contact information:   [address] [place] [address] [phone]       ----- Ranae Plumber, MD Attending Obstetrician and Gynecologist [facility]

## 2015-02-22 NOTE — Progress Notes (Addendum)
Pt had syncopal episode when using the commode . 0.59mL Ammonia administered as documented in EMAR. Pt recovered, was able to transfer to wheelchair and then to bed. BP 91/58, BG 115. fluid bolus administered. Will continue to monitor.

## 2015-02-22 NOTE — Anesthesia Preprocedure Evaluation (Signed)
Anesthesia Evaluation  Patient identified by MRN, date of birth, ID band Patient awake    Reviewed: Allergy & Precautions, H&P , NPO status , Patient's Chart, lab work & pertinent test results, reviewed documented beta blocker date and time   History of Anesthesia Complications Negative for: history of anesthetic complications  Airway Mallampati: II  TM Distance: >3 FB Neck ROM: full    Dental no notable dental hx. (+) Teeth Intact   Pulmonary neg pulmonary ROS,  breath sounds clear to auscultation  Pulmonary exam normal       Cardiovascular Exercise Tolerance: Good negative cardio ROS Normal cardiovascular examRhythm:regular Rate:Normal     Neuro/Psych negative neurological ROS  negative psych ROS   GI/Hepatic negative GI ROS, Neg liver ROS,   Endo/Other  diabetesMorbid obesity  Renal/GU negative Renal ROS  negative genitourinary   Musculoskeletal   Abdominal   Peds  Hematology  (+) Blood dyscrasia, anemia ,   Anesthesia Other Findings Past Medical History:   Anemia                                                       Diabetes mellitus without complication                         Comment:gestational   Reproductive/Obstetrics (+) Pregnancy                             Anesthesia Physical Anesthesia Plan  ASA: III  Anesthesia Plan: Epidural   Post-op Pain Management:    Induction:   Airway Management Planned:   Additional Equipment:   Intra-op Plan:   Post-operative Plan:   Informed Consent: I have reviewed the patients History and Physical, chart, labs and discussed the procedure including the risks, benefits and alternatives for the proposed anesthesia with the patient or authorized representative who has indicated his/her understanding and acceptance.   Dental Advisory Given  Plan Discussed with: Anesthesiologist, CRNA and Surgeon  Anesthesia Plan Comments:          Anesthesia Quick Evaluation

## 2015-02-23 LAB — CBC
HCT: 23.9 % — ABNORMAL LOW (ref 35.0–47.0)
HEMOGLOBIN: 7.9 g/dL — AB (ref 12.0–16.0)
MCH: 26.6 pg (ref 26.0–34.0)
MCHC: 33.2 g/dL (ref 32.0–36.0)
MCV: 80 fL (ref 80.0–100.0)
Platelets: 170 10*3/uL (ref 150–440)
RBC: 2.98 MIL/uL — ABNORMAL LOW (ref 3.80–5.20)
RDW: 16.8 % — ABNORMAL HIGH (ref 11.5–14.5)
WBC: 11.3 10*3/uL — ABNORMAL HIGH (ref 3.6–11.0)

## 2015-02-23 NOTE — Anesthesia Postprocedure Evaluation (Signed)
  Anesthesia Post-op Note  Patient: Katherine Chase  Procedure(s) Performed: * No procedures listed *  Anesthesia type:Epidural  Patient location: PACU  Post pain: Pain level controlled  Post assessment: Post-op Vital signs reviewed, Patient's Cardiovascular Status Stable, Respiratory Function Stable, Patent Airway and No signs of Nausea or vomiting  Post vital signs: Reviewed and stable  Last Vitals:  Filed Vitals:   02/23/15 1209  BP: 121/54  Pulse: 80  Temp: 36.4 C  Resp: 20    Level of consciousness: awake, alert  and patient cooperative  Complications: No apparent anesthesia complications

## 2015-02-23 NOTE — Progress Notes (Signed)
Admit Date: 02/21/2015 Today's Date: 02/23/2015  Post Partum Day 1  Subjective:  no complaints and ambulates well now without dizziness (one episode yesterday after delivery of dizziness)  Objective: Temp:  [97.5 F (36.4 C)-98.9 F (37.2 C)] 97.5 F (36.4 C) (09/04 1209) Pulse Rate:  [69-124] 80 (09/04 1209) Resp:  [18-20] 20 (09/04 1209) BP: (84-133)/(27-89) 121/54 mmHg (09/04 1209) SpO2:  [98 %-100 %] 99 % (09/04 1209)  Physical Exam:  General: alert, cooperative and fatigued Lochia: appropriate Uterine Fundus: firm Incision: none DVT Evaluation: No evidence of DVT seen on physical exam.   Recent Labs  02/22/15 1658 02/23/15 0452  HGB 9.6* 7.9*  HCT 29.9* 23.9*    Assessment/Plan: Plan for discharge tomorrow, Breastfeeding, Lactation consult, Contraception (plans Nexplanon) and Infant doing well   LOS: 2 days   Katherine Chase [facility] 02/23/2015, 12:18 PM

## 2015-02-24 MED ORDER — IBUPROFEN 600 MG PO TABS: 600.0000 mg | ORAL_TABLET | Freq: Four times a day (QID) | ORAL | Status: DC

## 2015-02-24 MED ORDER — ACETAMINOPHEN 325 MG PO TABS: 650.0000 mg | ORAL_TABLET | ORAL | Status: DC | PRN

## 2015-02-24 NOTE — Progress Notes (Signed)
Patient discharged home with infant. Discharge instructions, prescriptions and follow up appointment given to and reviewed with patient. Patient verbalized understanding. Escorted out with infant by Melissa Penland, NT. 

## 2015-02-24 NOTE — Discharge Instructions (Signed)
Discharge instructions:   Call office if you have any of the following: headache, visual changes, fever >100 F, chills, breast concerns, excessive vaginal bleeding, leg pain or redness, depression or any other concerns.   Activity: Do not lift > 10 lbs for 6 weeks.  No intercourse or tampons for 6 weeks.  No driving for 1-2 weeks.   Call your doctor for increased pain or vaginal bleeding, temperature above 101.0, depression, or concerns.  No strenuous activity or heavy lifting for 6 weeks.  No intercourse, tampons, douching, or enemas for 6 weeks.  No tub baths-showers only.  No driving for 2 weeks or while taking pain medications.  Continue prenatal vitamin and iron.  Increase calories and fluids while breastfeeding.  You will need to have your blood sugars rechecked after your initial post partum period (6 weeks) to test for continued diabetes.  You will need to scheduled this appointment with our lab at [facility].

## 2016-04-22 ENCOUNTER — Ambulatory Visit: Payer: Medicaid Other | Admitting: Physical Therapy

## 2018-02-23 ENCOUNTER — Telehealth: Payer: Self-pay | Admitting: Certified Nurse Midwife

## 2018-02-23 NOTE — Telephone Encounter (Signed)
nexplanon

## 2018-02-23 NOTE — Telephone Encounter (Signed)
Noted. Will order to arrive by apt date/time. 

## 2018-02-23 NOTE — Telephone Encounter (Signed)
11/20 at 10:10 with CLG

## 2018-05-10 ENCOUNTER — Encounter: Payer: Self-pay | Admitting: Certified Nurse Midwife

## 2018-05-10 ENCOUNTER — Ambulatory Visit: Payer: Medicaid Other | Admitting: Certified Nurse Midwife

## 2018-05-10 ENCOUNTER — Ambulatory Visit (INDEPENDENT_AMBULATORY_CARE_PROVIDER_SITE_OTHER): Payer: Medicaid Other | Admitting: Certified Nurse Midwife

## 2018-05-10 VITALS — BP 130/76 | HR 72 | Ht 66.5 in | Wt 281.5 lb

## 2018-05-10 DIAGNOSIS — E669 Obesity, unspecified: Secondary | ICD-10-CM | POA: Insufficient documentation

## 2018-05-10 DIAGNOSIS — Z3049 Encounter for surveillance of other contraceptives: Secondary | ICD-10-CM

## 2018-05-10 DIAGNOSIS — Z3046 Encounter for surveillance of implantable subdermal contraceptive: Secondary | ICD-10-CM

## 2018-05-10 DIAGNOSIS — E119 Type 2 diabetes mellitus without complications: Secondary | ICD-10-CM

## 2018-05-10 NOTE — Progress Notes (Addendum)
Obstetrics & Gynecology Office Visit   Chief Complaint:  Chief Complaint  Patient presents with  . Contraception    nexplanon removal and reinsertion    History of Present Illness: 27 year old Hispanic female G2 P2, LMP 05/02/2018,  presents for a Nexplanon replacement. Her present Nexplanon was inserted 3 years ago. She has been happy with the nexplanon and would like a new Nexplanon inserted. Her menses are every 2 months, they last 3 days and are medium flow. Occasionally has a heavy flow. Denies dysmenorrhea. She has her annual gyn exams at [facility]. Her last was in February and she reports having a negative pap smear.   Review of Systems:  Review of Systems  Constitutional: Negative for chills, fever and weight loss.  HENT: Negative for congestion, sinus pain and sore throat.   Eyes: Negative for blurred vision and pain.  Respiratory: Negative for hemoptysis, shortness of breath and wheezing.   Cardiovascular: Negative for chest pain, palpitations and leg swelling.  Gastrointestinal: Negative for abdominal pain, blood in stool, diarrhea, heartburn, nausea and vomiting.  Genitourinary: Negative for dysuria, frequency, hematuria and urgency.  Musculoskeletal: Positive for joint pain (hips and knees). Negative for back pain and myalgias.  Skin: Negative for itching and rash.  Neurological: Negative for dizziness, tingling and headaches.  Endo/Heme/Allergies: Negative for environmental allergies and polydipsia. Does not bruise/bleed easily.       Negative for hirsutism   Psychiatric/Behavioral: Negative for depression. The patient is not nervous/anxious and does not have insomnia.      Past Medical History:  Past Medical History:  Diagnosis Date  . Anemia   . Diabetes mellitus without complication (HCC)    gestational  . Polycystic ovaries 2010    Past Surgical History:  Past Surgical History:  Procedure Laterality Date  . LAPAROSCOPIC GASTRIC  BANDING  2010  . WISDOM TOOTH EXTRACTION      Gynecologic History: Patient's last menstrual period was 05/02/2018 (exact date).  Obstetric History: [id number]  Family History:  Family History  Problem Relation Age of Onset  . Diabetes Father   . Diabetes Paternal Uncle   . Diabetes Maternal Grandmother   . Hypertension Maternal Grandmother   . Cancer Mother 5       leg ca  . Hypertension Mother   . Thyroid disease Mother        HYPO  . Heart disease Maternal Grandfather        died of MI  . Hypertension Maternal Uncle     Social History:  Social History   Socioeconomic History  . Marital status: Single    Spouse name: Not on file  . Number of children: 2  . Years of education: 5814  . Highest education level: Not on file  Occupational History  . Occupation: CNA  . Occupation: med [employer]  Social Needs  . Financial resource strain: Not on file  . Food insecurity:    Worry: Not on file    Inability: Not on file  . Transportation needs:    Medical: Not on file    Non-medical: Not on file  Tobacco Use  . Smoking status: Never Smoker  . Smokeless tobacco: Never Used  Substance and Sexual Activity  . Alcohol use: No    Alcohol/week: 0.0 standard drinks  . Drug use: No  . Sexual activity: Yes    Birth control/protection: Implant  Lifestyle  . Physical activity:    Days per week: Not  on file    Minutes per session: Not on file  . Stress: Not on file  Relationships  . Social connections:    Talks on phone: Not on file    Gets together: Not on file    Attends religious service: Not on file    Active member of club or organization: Not on file    Attends meetings of clubs or organizations: Not on file    Relationship status: Not on file  . Intimate partner violence:    Fear of current or ex partner: Not on file    Emotionally abused: Not on file    Physically abused: Not on file    Forced sexual activity: Not on file  Other Topics Concern  . Not on file  Social  History Narrative  . Not on file    Allergies:  No Known Allergies  Medications: Prior to Admission medications   Medication Sig Start Date End Date Taking? Authorizing Provider  acetaminophen (TYLENOL) 325 MG tablet Take 2 tablets (650 mg total) by mouth every 4 (four) hours as needed (for pain scale < 4). 02/24/15   Ward, Elenora Fender, MD  glipiZIDE (GLUCOTROL XL) 10 MG 24 hr tablet Take 1 tablet by mouth daily. 03/02/18   [provider]  ibuprofen (ADVIL,MOTRIN) 600 MG tablet Take 1 tablet (600 mg total) by mouth every 6 (six) hours. 02/24/15   Ward, Elenora Fender, MD  metFORMIN (GLUCOPHAGE-XR) 500 MG 24 hr tablet Take 2 tablets by mouth 2 (two) times daily. 03/03/18   [provider]    Physical Exam Vitals:BP 130/76   Pulse 72   Ht 5' 6.5" (1.689 m)   Wt 281 lb 8 oz (127.7 kg)   LMP 05/02/2018 (Exact Date)   BMI 44.75 kg/m      Physical Exam  Constitutional: She is oriented to person, place, and time.  Pleasant obese female in NAD  Neurological: She is alert and oriented to person, place, and time.  Skin: Skin is warm and dry.  Psychiatric: She has a normal mood and affect.  Extremities: Nexplanon palpated in her left upper arm   Assessment: 27 y.o. [id number] desires Nexplanon replacement Plan: See procedure note below Informed consent was obtained for removal and insertion of a new Nexplanon.  Nexplanon removal  The Nexplanon was easily palpated in the patient's left arm and the distal end was identified. The skin was marked to identify where the new Nexplanon would be placed.  The skin was cleansed with a Betadine solution. A small injection of subcutaneous lidocaine 2% was given over the distal end of the implant. An incision was made at the end of the implant. The rod was pushed toward the incision and grasped with a hemostat. It was noted to be intact.   Nexplanon Insertion 1.5 cc of 2% lidocaine was injected through the incision. Nexplanon removed form  packaging,  Device confirmed in needle, then inserted full length of needle and withdrawn per handbook instructions.  Pt insertion site covered with steri-strip and a bandaid. A pressure dressing was then applied. Minimal blood loss.  Pt tolerated the procedure well. She was then given post procedure instructions. RTO prn.  Farrel Conners, CNM

## 2018-07-27 ENCOUNTER — Telehealth: Payer: Self-pay

## 2018-07-27 NOTE — Telephone Encounter (Signed)
Pt had nexplanon inserted a couple months ago.  Since then has had very heavy periods c clots that last 2-3 weeks then they stop for about 1 1/2 weeks then starts again.  [phone]

## 2018-07-28 NOTE — Telephone Encounter (Signed)
Please schedule her a pelvic ultrasound and an appointment with me

## 2018-07-31 NOTE — Telephone Encounter (Signed)
Left message for pt to call office back to make appt 

## 2019-08-07 NOTE — Telephone Encounter (Signed)
Nexplanon rcvd/charged 05/10/18

## 2019-11-07 ENCOUNTER — Encounter: Payer: Self-pay | Admitting: Obstetrics and Gynecology

## 2019-11-07 ENCOUNTER — Other Ambulatory Visit: Payer: Self-pay

## 2019-11-07 ENCOUNTER — Ambulatory Visit (INDEPENDENT_AMBULATORY_CARE_PROVIDER_SITE_OTHER): Payer: 59 | Admitting: Obstetrics and Gynecology

## 2019-11-07 ENCOUNTER — Other Ambulatory Visit (HOSPITAL_COMMUNITY)
Admission: RE | Admit: 2019-11-07 | Discharge: 2019-11-07 | Disposition: A | Discharge status: Home / Self Care | Payer: 59 | Source: Ambulatory Visit | Attending: Obstetrics and Gynecology | Admitting: Obstetrics and Gynecology

## 2019-11-07 VITALS — BP 122/82 | HR 99 | Ht 66.5 in | Wt 256.0 lb

## 2019-11-07 DIAGNOSIS — Z975 Presence of (intrauterine) contraceptive device: Secondary | ICD-10-CM

## 2019-11-07 DIAGNOSIS — Z1151 Encounter for screening for human papillomavirus (HPV): Secondary | ICD-10-CM | POA: Diagnosis not present

## 2019-11-07 DIAGNOSIS — N921 Excessive and frequent menstruation with irregular cycle: Secondary | ICD-10-CM | POA: Diagnosis not present

## 2019-11-07 DIAGNOSIS — Z113 Encounter for screening for infections with a predominantly sexual mode of transmission: Secondary | ICD-10-CM

## 2019-11-07 DIAGNOSIS — Z124 Encounter for screening for malignant neoplasm of cervix: Secondary | ICD-10-CM

## 2019-11-07 NOTE — Progress Notes (Signed)
Patient, No Pcp Per   Chief Complaint  Patient presents with  . Metrorrhagia    second 12 days cycle in 1 year    HPI:      [name] is a 29 y.o. [id number] whose LMP was Patient's last menstrual period was 10/26/2019., presents today for irregular bleeding with nexplanon, REplaced 11/19. Has irreg bleeding with nexplanon, usually every few months for 2-3 days. Had 12 days heavy bleeding with dime to quarter sized clots, starting 10/26/19. Sx have stopped today. Had similar sx 06/2018. Pt concerned about bleeding. Had recent HgB of 13.9. May want hyst due to being tired of periods. Hasn't tried IUD, ablation not done. Mom had hyst early due to bleeding issues. Also has dyspareunia after an issue with uterus during delivery. Has OAB/urge incont sx sometimes. Hx of DM.   Is not current on pap. Would like done today.   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 Partner recently diagnosed with type 2 herpes due to lesions. Pt has never had sx.    Past Medical History:  Diagnosis Date  . Anemia   . Diabetes mellitus without complication (HCC)    gestational  . Obesity   . Polycystic ovaries 2010    Past Surgical History:  Procedure Laterality Date  . LAPAROSCOPIC GASTRIC BANDING  2010  . WISDOM TOOTH EXTRACTION      Family History  Problem Relation Age of Onset  . Diabetes Father   . Diabetes Paternal Uncle   . Diabetes Maternal Grandmother   . Hypertension Maternal Grandmother   . Cancer Mother 5       leg ca  . Hypertension Mother   . Thyroid disease Mother        HYPO  . Heart disease Maternal Grandfather        died of MI  . Hypertension Maternal Uncle     Social History   Socioeconomic History  . Marital status: Single    Spouse name: Not on file  . Number of children: 2  . Years of education: 7  . Highest education level: Not on file  Occupational History  . Occupation: CNA  . Occupation: med [employer]  Tobacco Use  . Smoking status: Never  Smoker  . Smokeless tobacco: Never Used  Substance and Sexual Activity  . Alcohol use: No    Alcohol/week: 0.0 standard drinks  . Drug use: No  . Sexual activity: Yes    Birth control/protection: Implant  Other Topics Concern  . Not on file  Social History Narrative  . Not on file   Social Determinants of Health   Financial Resource Strain:   . Difficulty of Paying Living Expenses:   Food Insecurity:   . Worried About Programme researcher, broadcasting/film/video in the Last Year:   . Barista in the Last Year:   Transportation Needs:   . Freight forwarder (Medical):   Marland Kitchen Lack of Transportation (Non-Medical):   Physical Activity:   . Days of Exercise per Week:   . Minutes of Exercise per Session:   Stress:   . Feeling of Stress :   Social Connections:   . Frequency of Communication with Friends and Family:   . Frequency of Social Gatherings with Friends and Family:   . Attends Religious Services:   . Active Member of Clubs or Organizations:   . Attends Banker Meetings:   Marland Kitchen Marital Status:  Intimate Partner Violence:   . Fear of Current or Ex-Partner:   . Emotionally Abused:   Marland Kitchen Physically Abused:   . Sexually Abused:     Outpatient Medications Prior to Visit  Medication Sig Dispense Refill  . etonogestrel (NEXPLANON) 68 MG IMPL implant 1 each by Subdermal route once.    . metFORMIN (GLUCOPHAGE-XR) 500 MG 24 hr tablet Take 2 tablets by mouth 2 (two) times daily.  2  . acetaminophen (TYLENOL) 325 MG tablet Take 2 tablets (650 mg total) by mouth every 4 (four) hours as needed (for pain scale < 4). (Patient not taking: Reported on 11/07/2019)    . glipiZIDE (GLUCOTROL XL) 10 MG 24 hr tablet Take 1 tablet by mouth daily.  11  . ibuprofen (ADVIL,MOTRIN) 600 MG tablet Take 1 tablet (600 mg total) by mouth every 6 (six) hours. (Patient not taking: Reported on 11/07/2019) 30 tablet 0   No facility-administered medications prior to visit.      ROS:  Review of Systems   Constitutional: Negative for fever.  Gastrointestinal: Negative for blood in stool, constipation, diarrhea, nausea and vomiting.  Genitourinary: Positive for difficulty urinating, dyspareunia, frequency and menstrual problem. Negative for dysuria, flank pain, hematuria, urgency, vaginal bleeding, vaginal discharge and vaginal pain.  Musculoskeletal: Negative for back pain.  Skin: Negative for rash.    OBJECTIVE:   Vitals:  BP 122/82 (BP Location: Left Arm, Patient Position: Sitting, Cuff Size: Normal)   Pulse 99   Ht 5' 6.5" (1.689 m)   Wt 256 lb (116.1 kg)   LMP 10/26/2019   BMI 40.70 kg/m   Physical Exam Vitals reviewed.  Constitutional:      Appearance: She is well-developed.  Pulmonary:     Effort: Pulmonary effort is normal.  Genitourinary:    General: Normal vulva.     Pubic Area: No rash.      Labia:        Right: No rash, tenderness or lesion.        Left: No rash, tenderness or lesion.      Vagina: Normal. No vaginal discharge, erythema or tenderness.     Cervix: Normal.     Uterus: Normal. Not enlarged and not tender.      Adnexa: Right adnexa normal and left adnexa normal.       Right: No mass or tenderness.         Left: No mass or tenderness.    Musculoskeletal:        General: Normal range of motion.     Cervical back: Normal range of motion.  Skin:    General: Skin is warm and dry.  Neurological:     General: No focal deficit present.     Mental Status: She is alert and oriented to person, place, and time.  Psychiatric:        Mood and Affect: Mood normal.        Behavior: Behavior normal.        Thought Content: Thought content normal.        Judgment: Judgment normal.     Assessment/Plan: Breakthrough bleeding on Nexplanon--reassurance. Normal sx with nexplanon. Can add POPs for cycle control vs removal. Also discussed IUD and ablation. Pt may just want hyst even though may not be indicated. F/u with MD to discuss further prn.   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 Will call with results.     Return if symptoms worsen or fail to improve.  Alicia B. Copland, PA-C 11/08/2019 9:13 AM

## 2019-11-08 ENCOUNTER — Encounter: Payer: Self-pay | Admitting: Obstetrics and Gynecology

## 2019-11-08 LAB — HSV 2 ANTIBODY, IGG: HSV 2 IgG, Type Spec: 12.8 index — ABNORMAL HIGH (ref 0.00–0.90)

## 2019-11-08 NOTE — Patient Instructions (Signed)
I value your feedback and entrusting us with your care. If you get a [facility] patient survey, I would appreciate you taking the time to let us know about your experience today. Thank you!  As of May 31, 2019, your lab results will be released to your MyChart immediately, before I even have a chance to see them. Please give me time to review them and contact you if there are any abnormalities. Thank you for your patience.  

## 2019-11-09 LAB — CYTOLOGY - PAP
Chlamydia: NEGATIVE
Comment: NEGATIVE
Comment: NEGATIVE
Comment: NORMAL
Diagnosis: NEGATIVE
High risk HPV: NEGATIVE
Neisseria Gonorrhea: NEGATIVE

## 2020-04-22 ENCOUNTER — Other Ambulatory Visit: Payer: Self-pay | Admitting: Nurse Practitioner

## 2020-04-22 DIAGNOSIS — N912 Amenorrhea, unspecified: Secondary | ICD-10-CM

## 2020-05-05 ENCOUNTER — Ambulatory Visit
Admission: RE | Admit: 2020-05-05 | Discharge: 2020-05-05 | Disposition: A | Discharge status: Home / Self Care | Payer: 59 | Source: Ambulatory Visit | Attending: Nurse Practitioner | Admitting: Nurse Practitioner

## 2020-05-05 ENCOUNTER — Other Ambulatory Visit: Payer: Self-pay

## 2020-05-05 DIAGNOSIS — N912 Amenorrhea, unspecified: Secondary | ICD-10-CM | POA: Insufficient documentation

## 2020-05-14 ENCOUNTER — Other Ambulatory Visit: Payer: Self-pay | Admitting: Nurse Practitioner

## 2020-05-14 DIAGNOSIS — N83209 Unspecified ovarian cyst, unspecified side: Secondary | ICD-10-CM

## 2020-06-04 ENCOUNTER — Ambulatory Visit: Payer: 59

## 2020-08-19 HISTORY — PX: OTHER SURGICAL HISTORY: SHX169

## 2021-03-17 ENCOUNTER — Other Ambulatory Visit: Payer: Self-pay | Admitting: Nurse Practitioner

## 2021-03-17 DIAGNOSIS — N6320 Unspecified lump in the left breast, unspecified quadrant: Secondary | ICD-10-CM

## 2021-03-20 ENCOUNTER — Other Ambulatory Visit: Payer: 59

## 2021-03-27 ENCOUNTER — Other Ambulatory Visit: Payer: Self-pay

## 2021-03-27 ENCOUNTER — Ambulatory Visit
Admission: RE | Admit: 2021-03-27 | Discharge: 2021-03-27 | Disposition: A | Discharge status: Home / Self Care | Payer: 59 | Source: Ambulatory Visit | Attending: Nurse Practitioner | Admitting: Nurse Practitioner

## 2021-03-27 DIAGNOSIS — N6320 Unspecified lump in the left breast, unspecified quadrant: Secondary | ICD-10-CM

## 2021-04-13 ENCOUNTER — Encounter (INDEPENDENT_AMBULATORY_CARE_PROVIDER_SITE_OTHER): Payer: 59

## 2021-05-06 ENCOUNTER — Other Ambulatory Visit: Payer: Self-pay

## 2021-05-06 ENCOUNTER — Encounter: Payer: Self-pay | Admitting: Obstetrics and Gynecology

## 2021-05-06 ENCOUNTER — Ambulatory Visit (INDEPENDENT_AMBULATORY_CARE_PROVIDER_SITE_OTHER): Payer: 59 | Admitting: Obstetrics and Gynecology

## 2021-05-06 VITALS — BP 100/80 | Ht 66.0 in | Wt 271.0 lb

## 2021-05-06 DIAGNOSIS — Z3046 Encounter for surveillance of implantable subdermal contraceptive: Secondary | ICD-10-CM

## 2021-05-06 MED ORDER — ETONOGESTREL 68 MG ~~LOC~~ IMPL: 1.0000 | DRUG_IMPLANT | Freq: Once | SUBCUTANEOUS | 0 refills | Status: AC

## 2021-05-06 NOTE — Patient Instructions (Signed)
I value your feedback and you entrusting us with your care. If you get a [facility] patient survey, I would appreciate you taking the time to let us know about your experience today. Thank you!  Remove the dressing in 24 hours,  keep the incision area dry for 24 hours and remove the Steristrip in 2-3  days.  Notify us if any signs of tenderness, redness, pain, or fevers develop.   

## 2021-05-06 NOTE — Progress Notes (Signed)
   Chief Complaint  Patient presents with   Contraception    Nexplanon replacement     HPI:  Katherine Chase is a 30 y.o. [phone] here for Nexplanon removal and insertion. Nexplanon replaced 11/19. Doing well, has occas BTB, no dysmen. Current on pap  BP 100/80   Ht 5\' 6"  (1.676 m)   Wt 271 lb (122.9 kg)   Breastfeeding No   BMI 43.74 kg/m    Nexplanon removal Procedure note - The Nexplanon was noted in the patient's arm and the end was identified. The skin was cleansed with a Betadine solution. A small injection of subcutaneous lidocaine with epinephrine was given over the end of the implant. An incision was made at the end of the implant. The rod was noted in the incision and grasped with a hemostat. It was noted to be intact.  Steri-Strip was placed approximating the incision. Hemostasis was noted.  Nexplanon Insertion  Patient given informed consent, signed copy in the chart, time out was performed.  Appropriate time out taken.  Patient's LEFT arm was prepped and draped in the usual sterile fashion. The ruler used to measure and mark insertion area.  Pt was prepped with betadine swab and then injected with 1.0 cc of 2% lidocaine with epinephrine. Nexplanon removed form packaging,  Device confirmed in needle, then inserted full length of needle and withdrawn per handbook instructions.  Pt insertion site covered with steri-strip and a bandage.   Minimal blood loss.  Pt tolerated the procedure well.  Assessment: Encounter for removal and reinsertion of Nexplanon - Plan: etonogestrel (NEXPLANON) 68 MG IMPL implant   Meds ordered this encounter  Medications   etonogestrel (NEXPLANON) 68 MG IMPL implant    Sig: 1 each (68 mg total) by Subdermal route once for 1 dose.    Dispense:  1 each    Refill:  0    Order Specific Question:   Supervising Provider    Answer:   Nadara Mustard      Plan:   She was told to remove the dressing in 12-24 hours, to keep the incision  area dry for 24 hours and to remove the Steristrip in 2-3  days.  Notify 06-[birth year]-1992 if any signs of tenderness, redness, pain, or fevers develop.   Anvay Tennis B. Sotirios Navarro, PA-C 05/06/2021 9:50 AM

## 2021-05-11 ENCOUNTER — Encounter (INDEPENDENT_AMBULATORY_CARE_PROVIDER_SITE_OTHER): Payer: 59

## 2022-03-29 ENCOUNTER — Other Ambulatory Visit: Payer: Self-pay | Admitting: Nurse Practitioner

## 2022-03-29 DIAGNOSIS — R160 Hepatomegaly, not elsewhere classified: Secondary | ICD-10-CM

## 2022-04-05 ENCOUNTER — Ambulatory Visit
Admission: RE | Admit: 2022-04-05 | Discharge: 2022-04-05 | Disposition: A | Discharge status: Home / Self Care | Payer: 59 | Source: Ambulatory Visit | Attending: Nurse Practitioner | Admitting: Nurse Practitioner

## 2022-04-05 DIAGNOSIS — R160 Hepatomegaly, not elsewhere classified: Secondary | ICD-10-CM | POA: Diagnosis present

## 2022-04-05 MED ORDER — GADOBUTROL 1 MMOL/ML IV SOLN
10.0000 mL | Freq: Once | INTRAVENOUS | Status: AC | PRN
  Administered 2022-04-05: 10 mL via INTRAVENOUS

## 2022-04-19 ENCOUNTER — Encounter (INDEPENDENT_AMBULATORY_CARE_PROVIDER_SITE_OTHER): Payer: Self-pay

## 2022-08-05 ENCOUNTER — Ambulatory Visit (INDEPENDENT_AMBULATORY_CARE_PROVIDER_SITE_OTHER): Payer: 59 | Admitting: Cardiovascular Disease

## 2022-08-05 ENCOUNTER — Encounter: Payer: Self-pay | Admitting: Cardiovascular Disease

## 2022-08-05 ENCOUNTER — Other Ambulatory Visit: Payer: Self-pay | Admitting: Cardiovascular Disease

## 2022-08-05 VITALS — BP 118/78 | HR 97 | Ht 66.0 in | Wt 251.4 lb

## 2022-08-05 DIAGNOSIS — R002 Palpitations: Secondary | ICD-10-CM | POA: Diagnosis not present

## 2022-08-05 DIAGNOSIS — I4711 Inappropriate sinus tachycardia, so stated: Secondary | ICD-10-CM

## 2022-08-05 NOTE — Progress Notes (Signed)
Cardiology Office Note   Date:  08/05/2022   ID:  TESLA RUEF, DOB 1990-09-20, MRN [id number]  PCP:  Evern Bio, NP  Cardiologist:  Neoma Laming, MD      History of Present Illness: Katherine Chase is a 32 y.o. female who presents for  Chief Complaint  Patient presents with   Follow-up    Echo results    Palpitations  This is a recurrent problem. The current episode started 1 to 4 weeks ago. The problem occurs rarely. The problem has been resolved.      Past Medical History:  Diagnosis Date   Anemia    Anxiety    Depression    Diabetes mellitus without complication ([facility])    gestational   Obesity    Polycystic ovaries 2010     Past Surgical History:  Procedure Laterality Date   LAPAROSCOPIC GASTRIC BANDING  2010   OTHER SURGICAL HISTORY  08/2020   Lapband Removal   WISDOM TOOTH EXTRACTION       Current Outpatient Medications  Medication Sig Dispense Refill   busPIRone (BUSPAR) 5 MG tablet Take 5 mg by mouth daily as needed.     citalopram (CELEXA) 10 MG tablet Take 10 mg by mouth daily.     JARDIANCE 25 MG TABS tablet Take 25 mg by mouth daily.     losartan (COZAAR) 25 MG tablet Take 25 mg by mouth daily.     metFORMIN (GLUCOPHAGE) 1000 MG tablet Take 1,000 mg by mouth 2 (two) times daily.     metoprolol succinate (TOPROL-XL) 25 MG 24 hr tablet Take 25 mg by mouth daily.     prochlorperazine (COMPAZINE) 10 MG tablet Take 10 mg by mouth every 6 (six) hours as needed.     rosuvastatin (CRESTOR) 10 MG tablet Take 10 mg by mouth at bedtime.     etonogestrel (NEXPLANON) 68 MG IMPL implant 1 each (68 mg total) by Subdermal route once for 1 dose. 1 each 0   insulin glargine (LANTUS) 100 UNIT/ML injection Inject 30 Units into the skin at bedtime.     metoprolol succinate (TOPROL-XL) 50 MG 24 hr tablet Take 50 mg by mouth daily.     No current facility-administered medications for this visit.    Allergies:   Patient has no known allergies.    Social  History:   reports that she has never smoked. She has never used smokeless tobacco. She reports that she does not drink alcohol and does not use drugs.   Family History:  family history includes Cancer (age of onset: 31) in her mother; Diabetes in her father, maternal grandmother, and paternal uncle; Heart disease in her maternal grandfather; Hypertension in her maternal grandmother, maternal uncle, and mother; Thyroid disease in her mother.    ROS:     Review of Systems  Constitutional: Negative.   HENT: Negative.    Eyes: Negative.   Respiratory: Negative.    Cardiovascular:  Positive for palpitations.  Gastrointestinal: Negative.   Genitourinary: Negative.   Musculoskeletal: Negative.   Skin: Negative.   Neurological: Negative.   Endo/Heme/Allergies: Negative.   Psychiatric/Behavioral: Negative.    All other systems reviewed and are negative.     All other systems are reviewed and negative.    PHYSICAL EXAM: VS:  BP 118/78   Pulse 97   Ht 5' 6"$  (1.676 m)   Wt 251 lb 6.4 oz (114 kg)   SpO2 99%   BMI 40.58 kg/m  ,  BMI Body mass index is 40.58 kg/m. Last weight:  Wt Readings from Last 3 Encounters:  08/05/22 251 lb 6.4 oz (114 kg)  05/06/21 271 lb (122.9 kg)  11/07/19 256 lb (116.1 kg)     Physical Exam Constitutional:      Appearance: Normal appearance.  Cardiovascular:     Rate and Rhythm: Normal rate and regular rhythm.     Heart sounds: Normal heart sounds.  Pulmonary:     Effort: Pulmonary effort is normal.     Breath sounds: Normal breath sounds.  Musculoskeletal:     Right lower leg: No edema.     Left lower leg: No edema.  Neurological:     Mental Status: She is alert.       EKG:   Recent Labs: No results found for requested labs within last 365 days.    Lipid Panel No results found for: "CHOL", "TRIG", "HDL", "CHOLHDL", "VLDL", "LDLCALC", "LDLDIRECT"    Other studies Reviewed: REASON FOR VISIT  Visit for: Echocardiogram/R00.2  Sex:    Female   wt= 250   lbs.  BP=98/70  Height=66.5    inches.        TESTS  Imaging: Echocardiogram:  An echocardiogram in (2-d) mode was performed and in Doppler mode with color flow velocity mapping was performed. The aortic valve cusps are abnormal 1.9 cm, flow velocity 1.6  m/s, and systolic calculated mean flow gradient 4  mmHg. Mitral valve diastolic peak flow velocity E .62    m/s and E/A ratio 0.9. Aortic root diameter 2.5  cm. The LVOT internal diameter 1.9 cm and flow velocity was abnormal 1.1  m/s. LV systolic dimension 2.7   cm, diastolic 4.1 cm, posterior wall thickness 1.2  cm, fractional shortening 30 %, and EF 60 %. IVS thickness 1.1 cm. LA dimension 3.2 cm  RIGHT atrium=  10.8  cm2.     ASSESSMENT  Technically difficult study due to body habitus.  Suboptimal study due to poor windows.  Normal chamber sizes.  Normal left ventricular systolic function.  Normal right ventricular systolic function.  Normal right ventricular diastolic function.  Normal left ventricular wall motion.  Normal right ventricular wall motion.  Normal pulmonary artery pressure.  No pericardial effusion.  Mild LVH.     THERAPY   Referring physician: Dionisio David  Sonographer: STU.  Neoma Laming MD  Electronically signed by: Neoma Laming     Date: 07/15/2022 12:45 Additional studies/ records that were reviewed today include:  Review of the above records demonstrates:       No data to display            ASSESSMENT AND PLAN:    ICD-10-CM   1. Inappropriate sinus tachycardia  I47.11    patient feels better with 75 mg daily metoprolol.    2. Palpitation  R00.2        Problem List Items Addressed This Visit   None Visit Diagnoses     Inappropriate sinus tachycardia    -  Primary   patient feels better with 75 mg daily metoprolol.   Relevant Medications   metoprolol succinate (TOPROL-XL) 50 MG 24 hr tablet   Palpitation              Disposition:   No  follow-ups on file.      Signed,  Neoma Laming, MD  08/05/2022 10:47 AM    [facility]

## 2022-08-20 ENCOUNTER — Encounter: Payer: Self-pay | Admitting: Nurse Practitioner

## 2022-09-02 ENCOUNTER — Encounter: Payer: Self-pay | Admitting: Nurse Practitioner

## 2022-09-03 ENCOUNTER — Other Ambulatory Visit: Payer: Self-pay

## 2022-09-03 MED ORDER — INSULIN GLARGINE 100 UNIT/ML ~~LOC~~ SOLN: 30.0000 [IU] | Freq: Every day | SUBCUTANEOUS | 3 refills | Status: DC

## 2022-09-29 ENCOUNTER — Other Ambulatory Visit: Payer: Self-pay | Admitting: Nurse Practitioner

## 2022-10-11 ENCOUNTER — Encounter: Payer: Self-pay | Admitting: Nurse Practitioner

## 2022-10-14 ENCOUNTER — Encounter: Payer: Self-pay | Admitting: Nurse Practitioner

## 2022-10-14 ENCOUNTER — Ambulatory Visit (INDEPENDENT_AMBULATORY_CARE_PROVIDER_SITE_OTHER): Payer: 59 | Admitting: Nurse Practitioner

## 2022-10-14 VITALS — BP 120/80 | HR 100 | Ht 66.0 in | Wt 242.8 lb

## 2022-10-14 DIAGNOSIS — C229 Malignant neoplasm of liver, not specified as primary or secondary: Secondary | ICD-10-CM

## 2022-10-14 DIAGNOSIS — E119 Type 2 diabetes mellitus without complications: Secondary | ICD-10-CM | POA: Diagnosis not present

## 2022-10-14 NOTE — Patient Instructions (Signed)
1) Pt to have surg on May 3 2) Follow up appt in Sept

## 2022-10-14 NOTE — Progress Notes (Signed)
Established Patient Office Visit  Subjective:  Patient ID: Katherine Chase, female    DOB: [birth year]  Age: 32 y.o. MRN: [id number]  No chief complaint on file.   3 month follow up appt and review of recent labs done with her oncologist.  A1c is at 11% and patient is with endocrinology so on meal time insulins and basal. Patient has surgery on May 3 - open right hepatectomy.      No other concerns at this time.   Past Medical History:  Diagnosis Date   Anemia    Anxiety    Depression    Diabetes mellitus without complication (HCC)    gestational   Obesity    Polycystic ovaries 2010    Past Surgical History:  Procedure Laterality Date   LAPAROSCOPIC GASTRIC BANDING  2010   OTHER SURGICAL HISTORY  08/2020   Lapband Removal   WISDOM TOOTH EXTRACTION      Social History   Socioeconomic History   Marital status: Single    Spouse name: Not on file   Number of children: 2   Years of education: 14   Highest education level: Not on file  Occupational History   Occupation: CNA   Occupation: med [employer]  Tobacco Use   Smoking status: Never   Smokeless tobacco: Never  Building services engineer Use: Never used  Substance and Sexual Activity   Alcohol use: No    Alcohol/week: 0.0 standard drinks of alcohol   Drug use: No   Sexual activity: Yes    Birth control/protection: Implant  Other Topics Concern   Not on file  Social History Narrative   Not on file   Social Determinants of Health   Financial Resource Strain: Not on file  Food Insecurity: Not on file  Transportation Needs: Not on file  Physical Activity: Not on file  Stress: Not on file  Social Connections: Not on file  Intimate Partner Violence: Not on file    Family History  Problem Relation Age of Onset   Diabetes Father    Diabetes Paternal Uncle    Diabetes Maternal Grandmother    Hypertension Maternal Grandmother    Cancer Mother 5       leg ca   Hypertension Mother    Thyroid disease Mother         HYPO   Heart disease Maternal Grandfather        died of MI   Hypertension Maternal Uncle     No Known Allergies  Review of Systems  Constitutional: Negative.   HENT: Negative.    Eyes: Negative.   Respiratory: Negative.    Cardiovascular: Negative.   Gastrointestinal:  Positive for nausea.  Genitourinary: Negative.   Musculoskeletal: Negative.   Skin: Negative.   Neurological: Negative.   Endo/Heme/Allergies: Negative.   Psychiatric/Behavioral: Negative.         Objective:   There were no vitals taken for this visit.  There were no vitals filed for this visit.  Physical Exam Vitals reviewed.  Constitutional:      Appearance: Normal appearance.  HENT:     Head: Normocephalic.     Nose: Nose normal.     Mouth/Throat:     Mouth: Mucous membranes are moist.  Eyes:     Pupils: Pupils are equal, round, and reactive to light.  Cardiovascular:     Rate and Rhythm: Normal rate and regular rhythm.  Pulmonary:     Effort: Pulmonary effort is normal.  Breath sounds: Normal breath sounds.  Abdominal:     General: Bowel sounds are normal.     Palpations: Abdomen is soft.  Musculoskeletal:        General: Normal range of motion.     Cervical back: Normal range of motion.  Skin:    General: Skin is warm and dry.  Neurological:     Mental Status: She is alert and oriented to person, place, and time.  Psychiatric:        Mood and Affect: Mood normal.        Behavior: Behavior normal.      No results found for any visits on 10/14/22.  No results found for this or any previous visit (from the past 2160 hour(s)).    Assessment & Plan:   Problem List Items Addressed This Visit   None   No follow-ups on file.   Total time spent:  minutes 35 Orson Eva, NP  10/14/2022

## 2022-11-04 ENCOUNTER — Ambulatory Visit: Payer: 59 | Admitting: Cardiovascular Disease

## 2022-12-20 DEATH — deceased

## 2023-03-17 ENCOUNTER — Ambulatory Visit: Payer: 59 | Admitting: Nurse Practitioner

## 2023-08-12 IMAGING — MG DIGITAL DIAGNOSTIC BILAT W/ TOMO W/ CAD
8 of 14 series · 8 of 40 positions shown · non-contrast
Comparison: None.

CLINICAL DATA: Mass felt on recent physical examination upper outer
left breast. No family history of breast cancer.

EXAM:
DIGITAL DIAGNOSTIC BILATERAL MAMMOGRAM WITH TOMOSYNTHESIS AND CAD;
ULTRASOUND LEFT BREAST LIMITED
TECHNIQUE: Bilateral digital diagnostic mammography and breast tomosynthesis
was performed. The images were evaluated with computer-aided
detection.; Targeted ultrasound examination of the left breast was
performed.

[L CC synth-2D (1 of 4)]
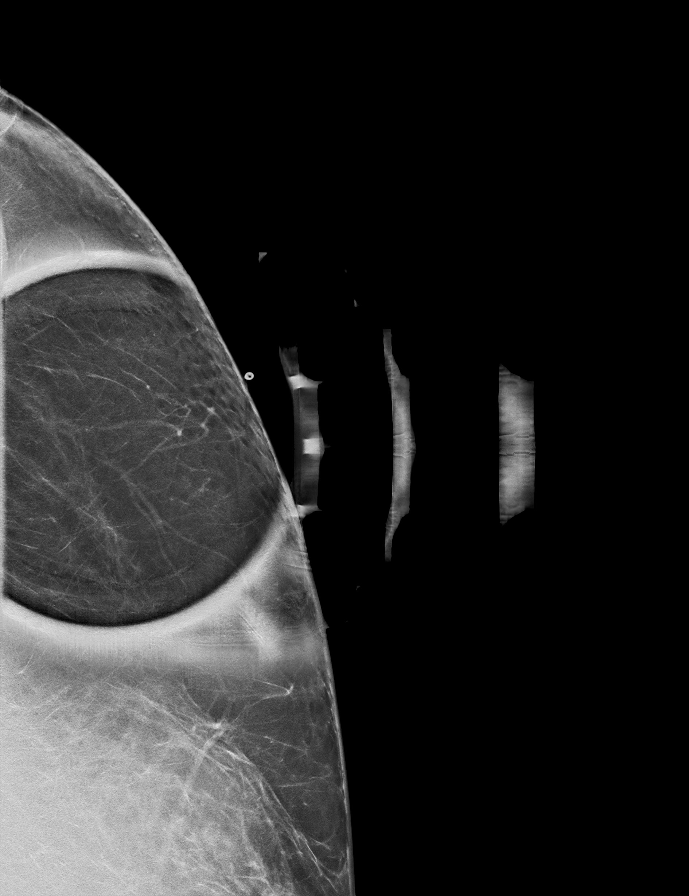

[L MLO synth-2D]
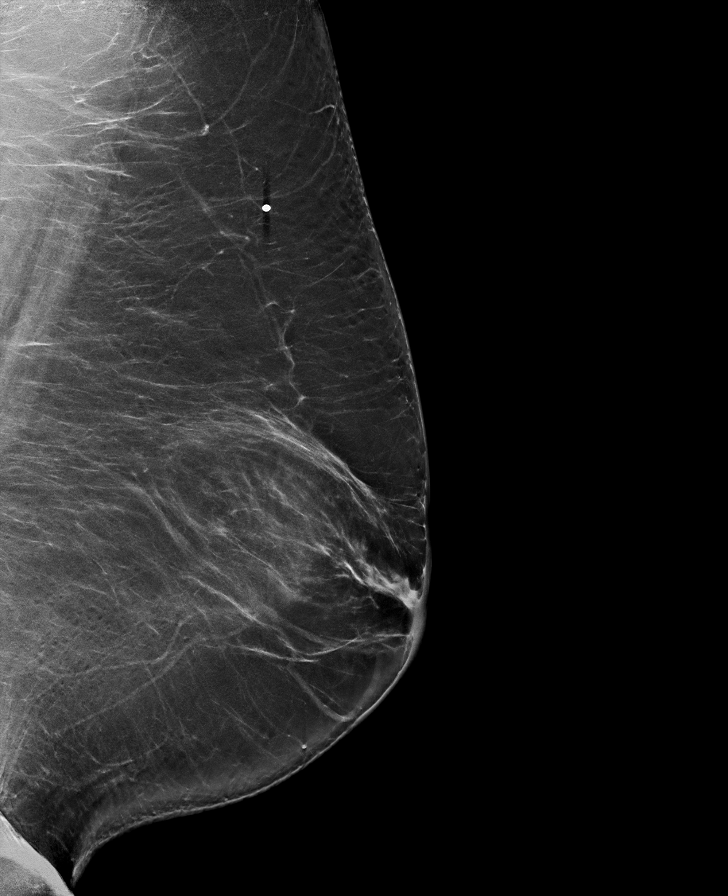

[R CC synth-2D]
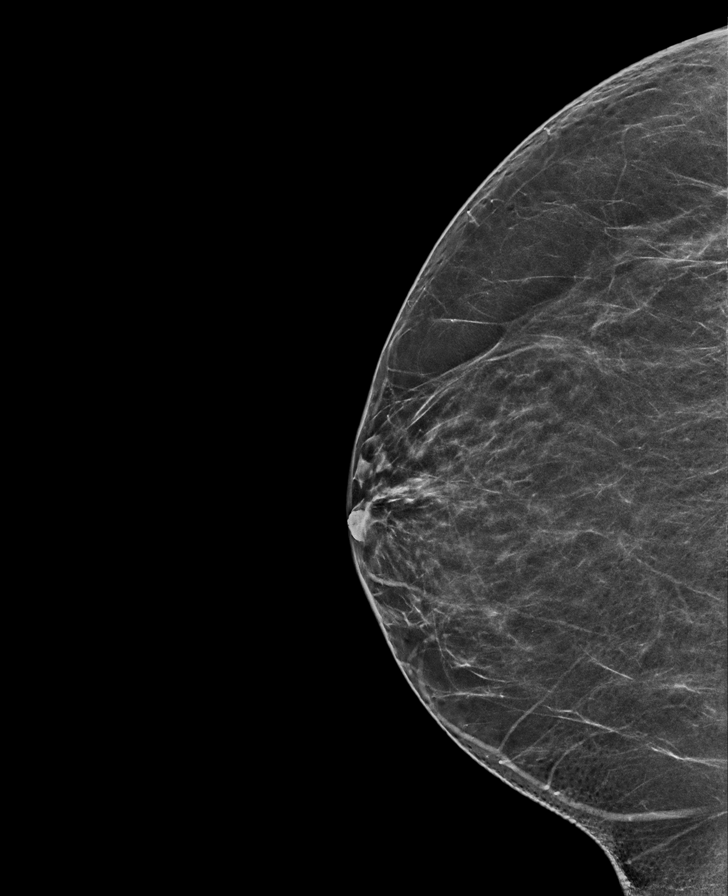

[R MLO synth-2D]
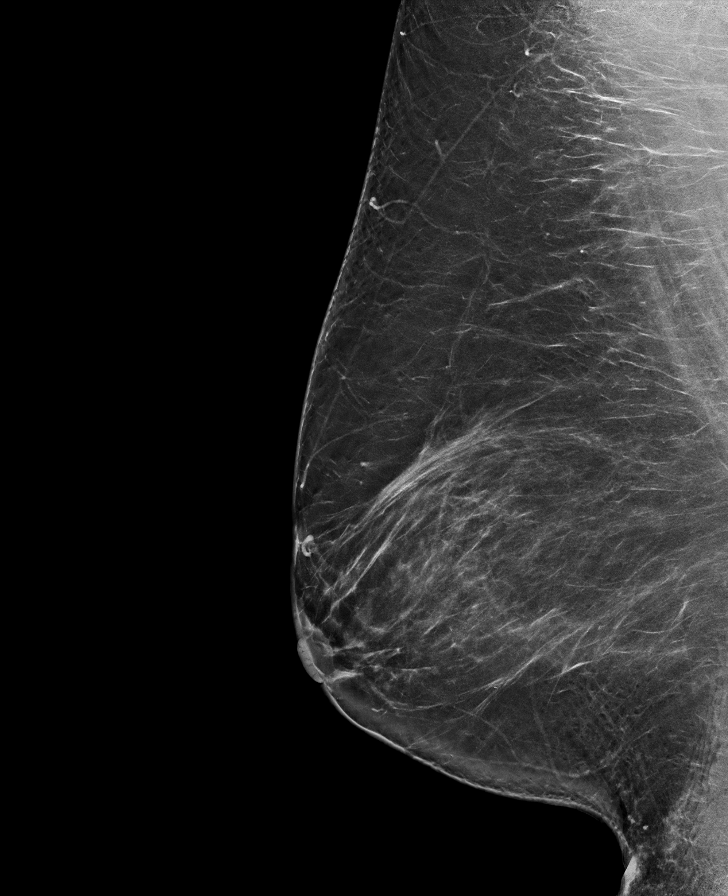

[L CC synth-2D (2 of 4)]
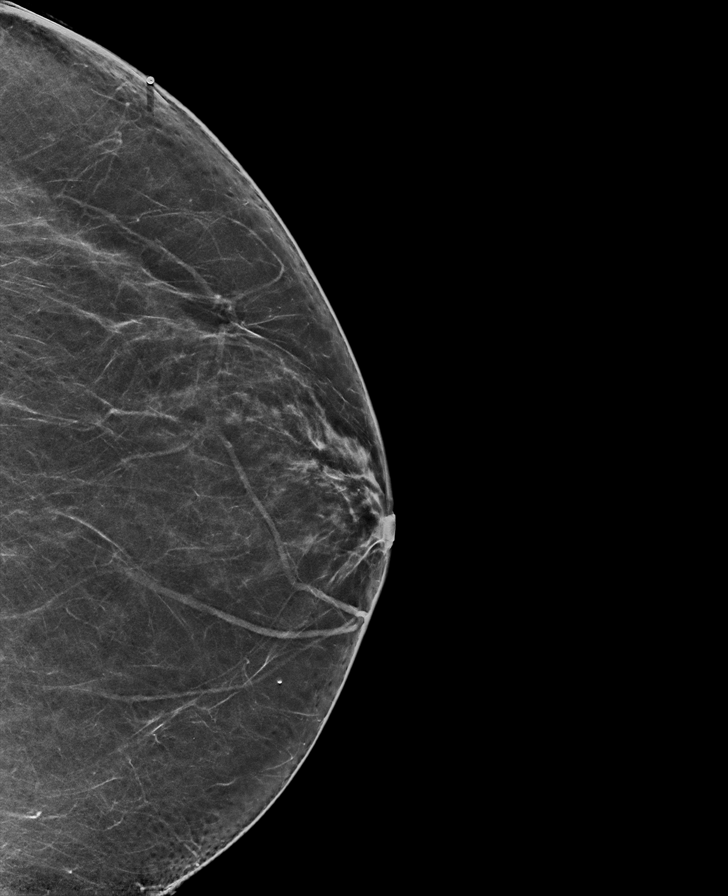

[L CC synth-2D (3 of 4)]
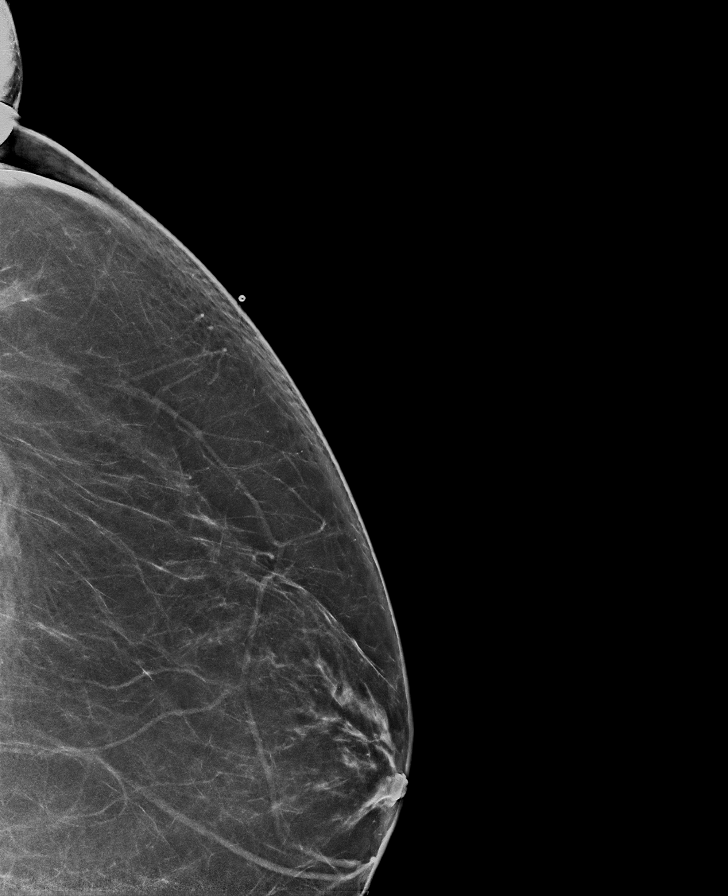

[L CC synth-2D (4 of 4)]
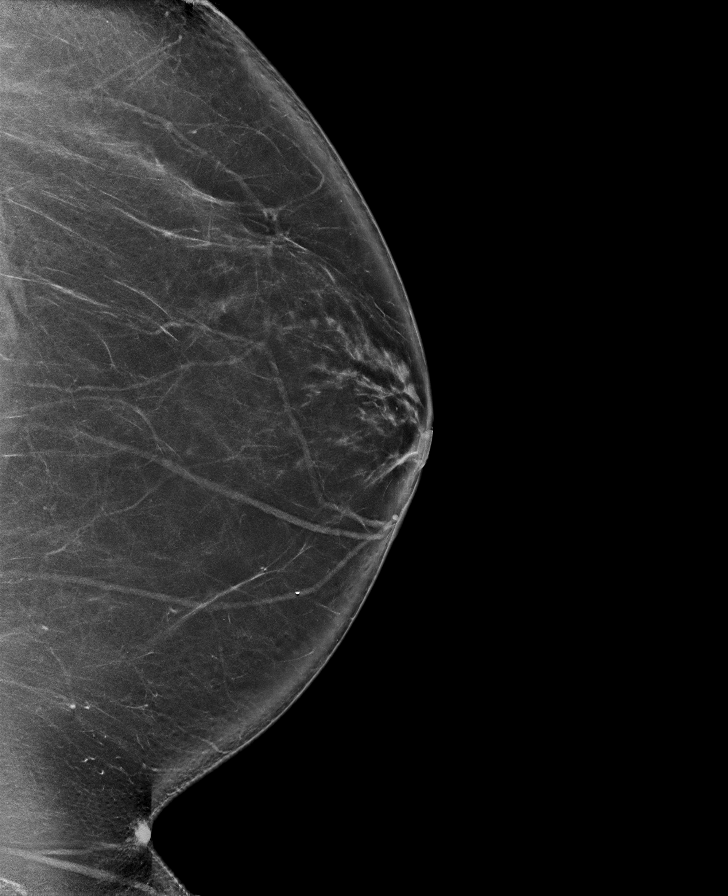

[R CC tomo · tomo slice 33/65.0]
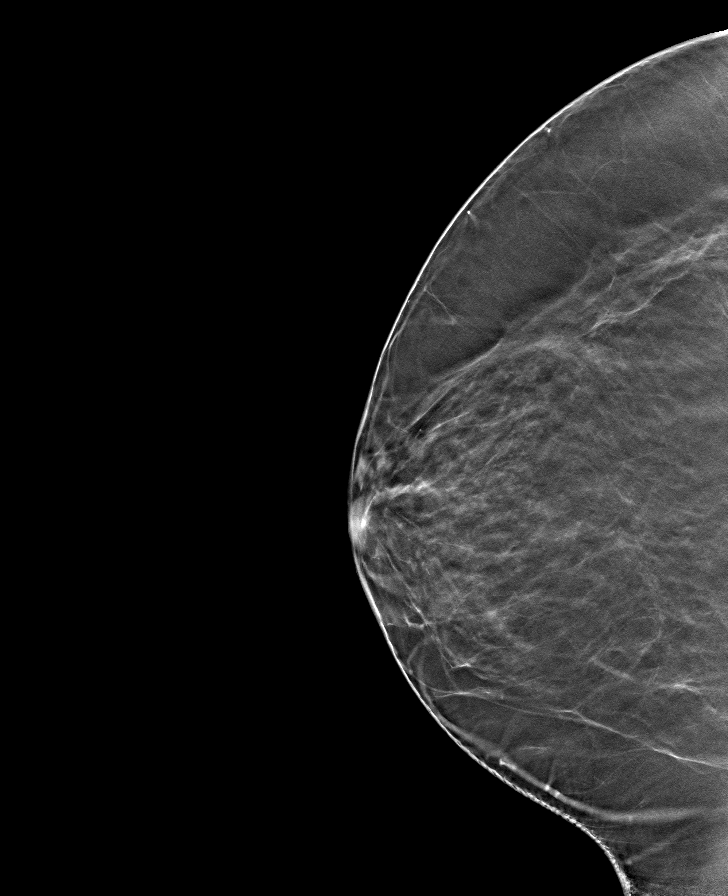

[8 of 40 positions shown; findings below may reference images not displayed]

Baseline.

ACR Breast Density Category b: There are scattered areas of
fibroglandular density.
FINDINGS: Mammographically normal appearing breasts with no mass or other
findings suspicious for malignancy in either breast.

On physical exam, the patient has an approximately 1.5 x 1.0 cm
oval, circumscribed, palpable mass-like area in the 2 to 3 o'clock
position of the left breast, 12 cm from the nipple.

Targeted ultrasound is performed, showing a fat lobule with convex
anterior margins corresponding to the palpable mass in the 2-3
o'clock position of the left breast, 12 cm from the nipple. No
cystic or solid masses were seen.
IMPRESSION: No evidence of malignancy.

RECOMMENDATION:
Annual screening mammography beginning at age 40.

I have discussed the findings and recommendations with the patient.
If applicable, a reminder letter will be sent to the patient
regarding the next appointment.

BI-RADS CATEGORY  1: Negative.
